# Patient Record
Sex: Female | Born: 1985 | Race: Black or African American | Hispanic: No | Marital: Single
Health system: Southern US, Community
[De-identification: ages and names within clinical notes are randomized; demographics above are authoritative.]

---
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
## 2004-05-02 ENCOUNTER — Ambulatory Visit (HOSPITAL_COMMUNITY): Admission: RE | Admit: 2004-05-02 | Discharge: 2004-05-02 | Payer: Self-pay | Admitting: *Deleted

## 2010-01-15 IMAGING — US US OB COMP LESS 14 WK
1 series · 14 of 18 positions shown · non-contrast
Comparison: none

CLINICAL DATA: Pregnant female with abdominal pain.  Gestational
age by last menstrual period is 8 weeks 3 days.

OBSTETRIC <14 WK US AND TRANSVAGINAL OB US
TECHNIQUE: Both transabdominal and transvaginal ultrasound
examinations were performed for complete evaluation of the
gestation as well as the maternal uterus, adnexal regions, and
pelvic cul-de-sac.

[Series 1: us ob comp less 14 wk · 0.28mm/px · 14 of 18 slices shown]
[im 1/18]
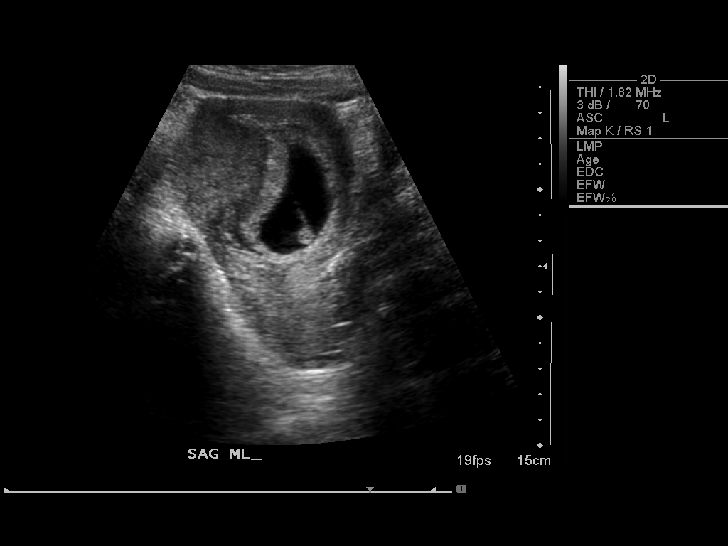
[im 2/18]
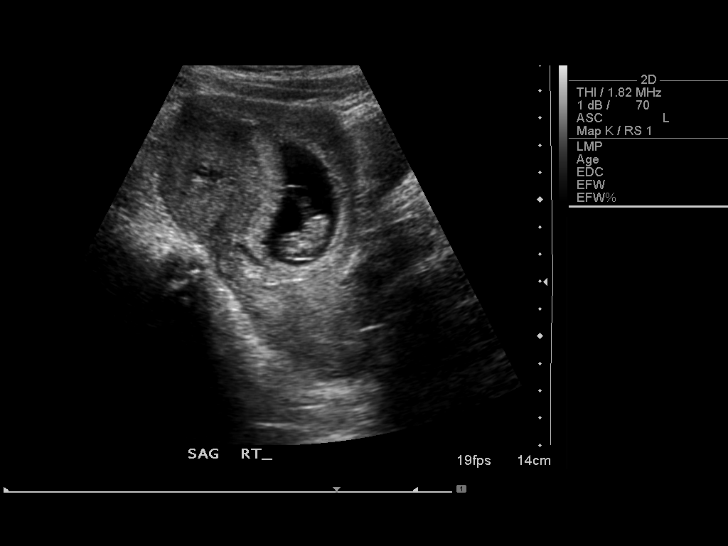
[im 4/18]
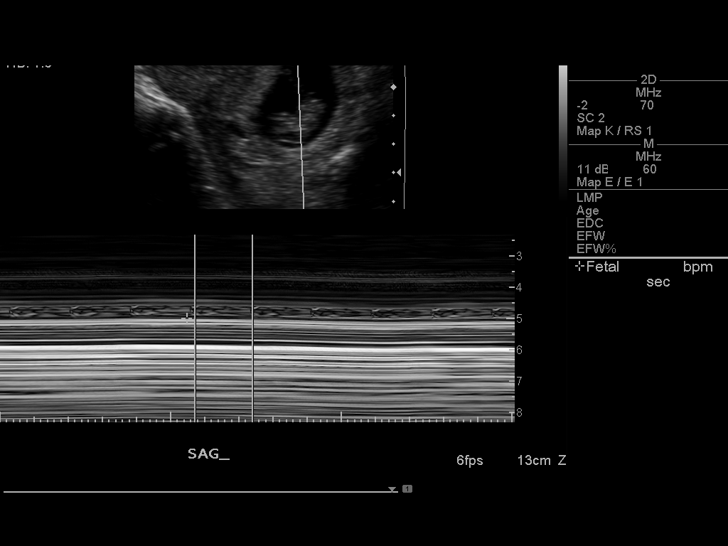
[im 5/18]
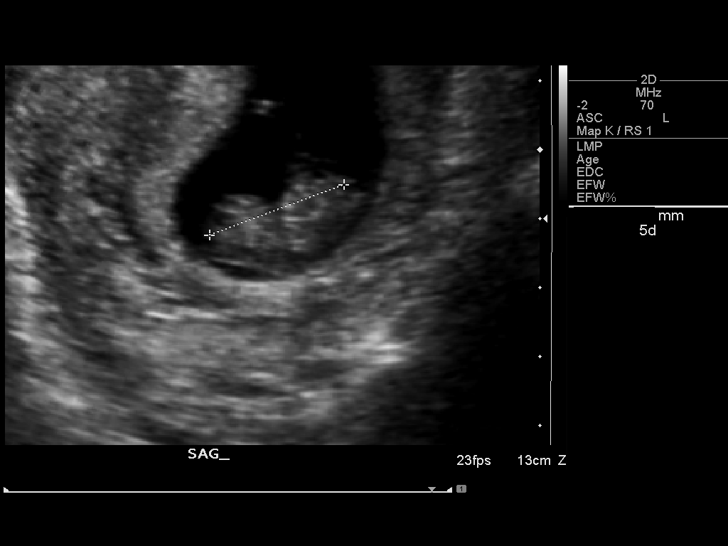
[im 6/18]
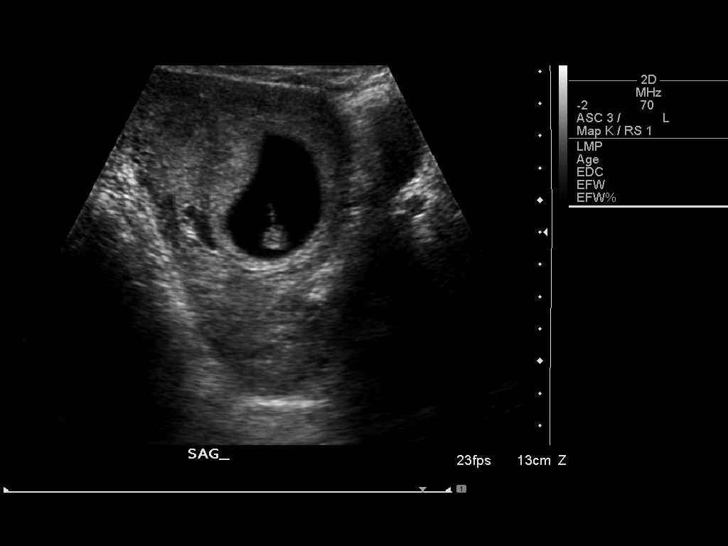
[im 8/18]
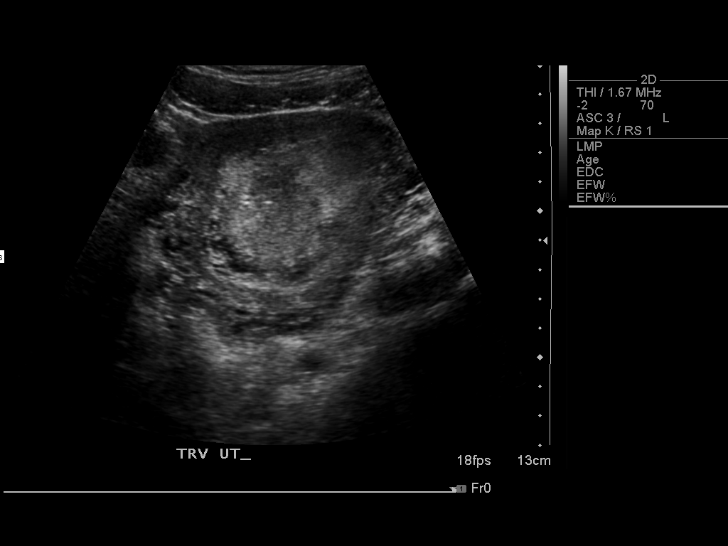
[im 9/18]
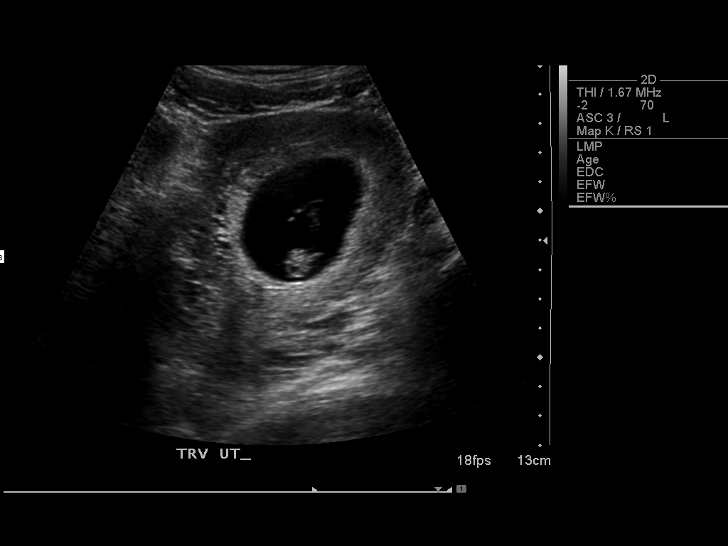
[im 10/18]
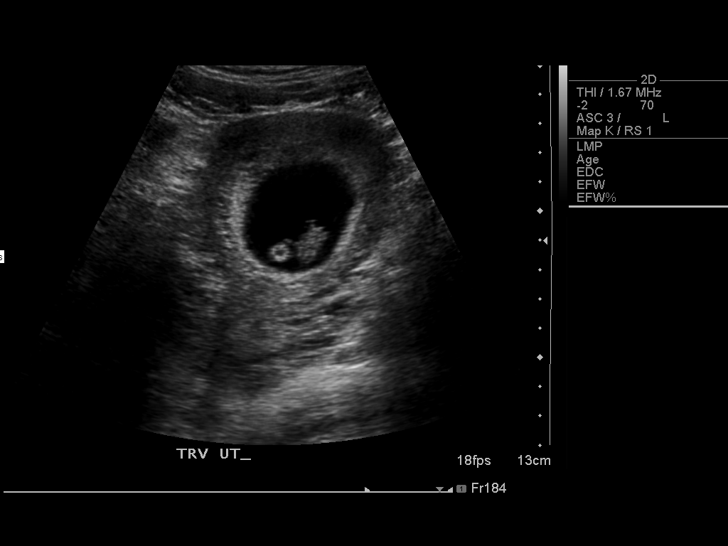
[im 11/18]
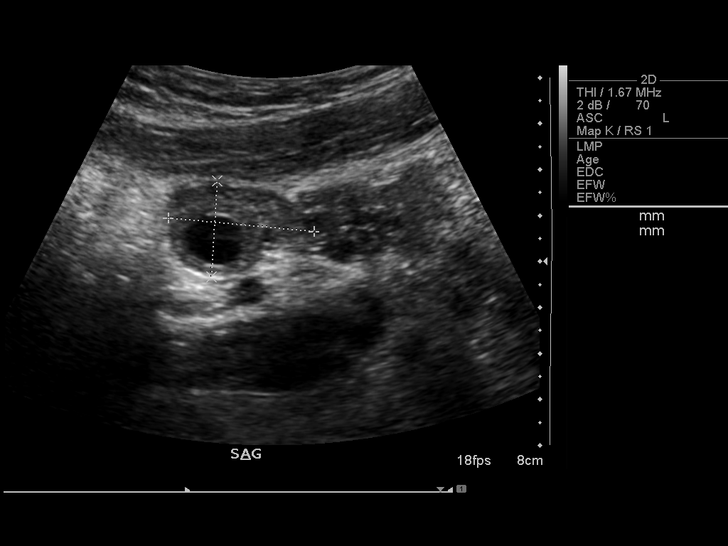
[im 13/18]
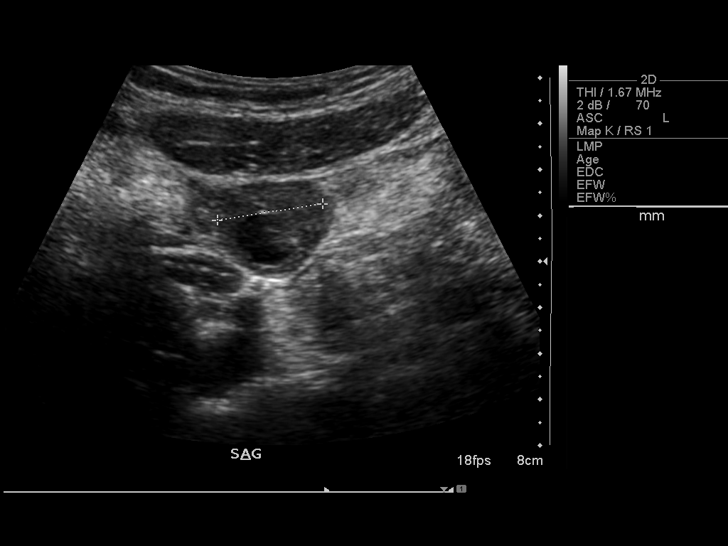
[im 14/18]
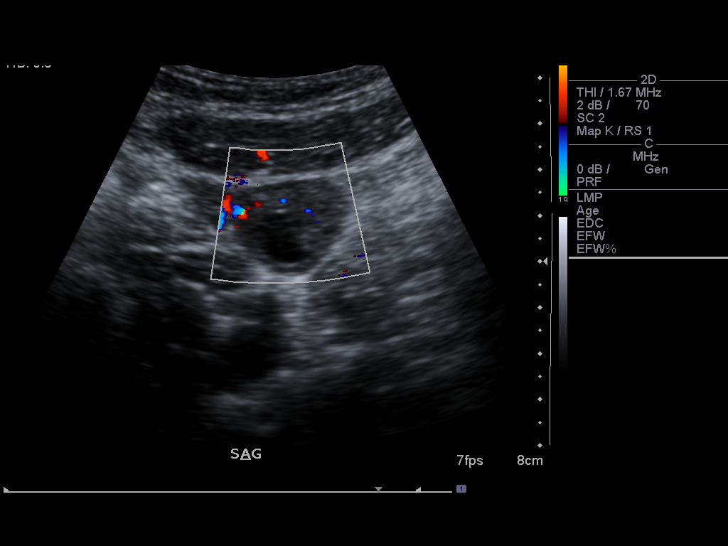
[im 15/18]
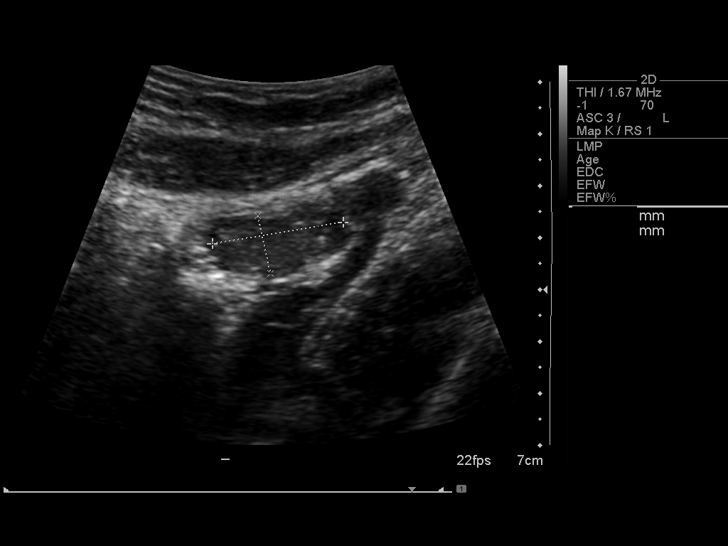
[im 17/18]
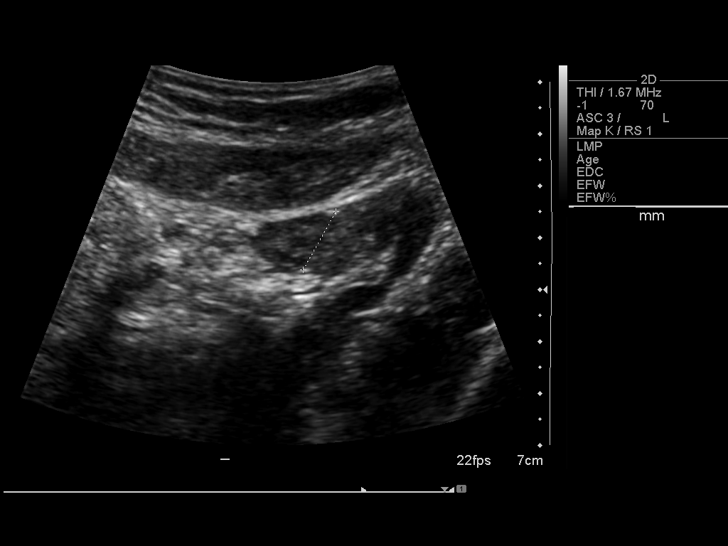
[im 18/18]
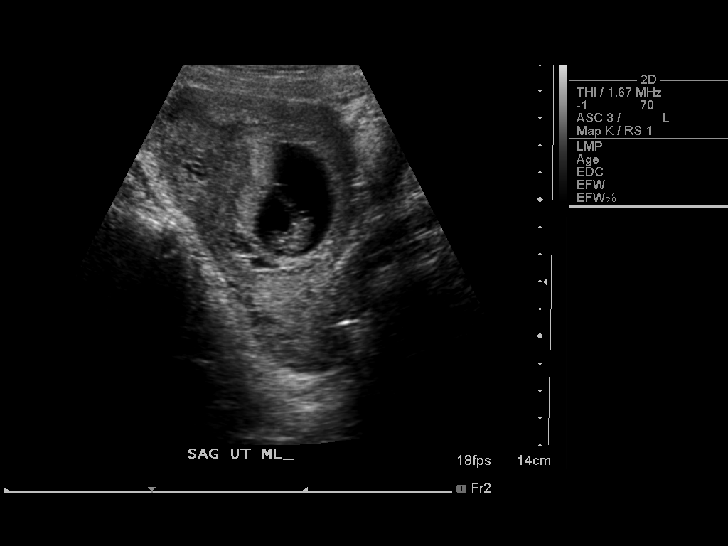

[14 of 18 positions shown; findings below may reference images not displayed]

A single living intrauterine pregnancy is identified with a heart
rate of 178 beats per minute.  Yolk sac is seen and appears normal.
Crown rump length is 20.8 mm for an estimated gestational age of 8
weeks 5 days.  Corpus luteum cyst is noted in the right ovary.
Left ovary appears normal.  Small subchorionic hemorrhage is seen.
No pelvic fluid.
IMPRESSION: Single living intrauterine pregnancy with estimated gestational age
of 8 weeks 5 days.  Small subchorionic hemorrhage is noted.

## 2019-05-02 ENCOUNTER — Encounter (HOSPITAL_COMMUNITY): Payer: Self-pay

## 2019-05-02 ENCOUNTER — Emergency Department (HOSPITAL_COMMUNITY)
Admission: EM | Admit: 2019-05-02 | Discharge: 2019-05-02 | Disposition: A | Discharge status: Home / Self Care | Payer: BC Managed Care – PPO | Attending: Emergency Medicine | Admitting: Emergency Medicine

## 2019-05-02 ENCOUNTER — Emergency Department (HOSPITAL_COMMUNITY): Payer: BC Managed Care – PPO

## 2019-05-02 ENCOUNTER — Other Ambulatory Visit: Payer: Self-pay

## 2019-05-02 DIAGNOSIS — K529 Noninfective gastroenteritis and colitis, unspecified: Secondary | ICD-10-CM | POA: Diagnosis not present

## 2019-05-02 DIAGNOSIS — R109 Unspecified abdominal pain: Secondary | ICD-10-CM | POA: Diagnosis present

## 2019-05-02 LAB — COMPREHENSIVE METABOLIC PANEL
ALT: 30 U/L (ref 0–44)
AST: 30 U/L (ref 15–41)
Albumin: 3.9 g/dL (ref 3.5–5.0)
Alkaline Phosphatase: 61 U/L (ref 38–126)
Anion gap: 10 (ref 5–15)
BUN: 9 mg/dL (ref 6–20)
CO2: 20 mmol/L — ABNORMAL LOW (ref 22–32)
Calcium: 8.8 mg/dL — ABNORMAL LOW (ref 8.9–10.3)
Chloride: 108 mmol/L (ref 98–111)
Creatinine, Ser: 0.82 mg/dL (ref 0.44–1.00)
GFR calc Af Amer: >60 mL/min (ref >60)
GFR calc non Af Amer: >60 mL/min (ref >60)
Glucose, Bld: 115 mg/dL — ABNORMAL HIGH (ref 70–99)
Potassium: 3.8 mmol/L (ref 3.5–5.1)
Sodium: 138 mmol/L (ref 135–145)
Total Bilirubin: 0.6 mg/dL (ref 0.3–1.2)
Total Protein: 7.8 g/dL (ref 6.5–8.1)

## 2019-05-02 LAB — CBC
HCT: 39.8 % (ref 36.0–46.0)
Hemoglobin: 11.8 g/dL — ABNORMAL LOW (ref 12.0–15.0)
MCH: 21.5 pg — ABNORMAL LOW (ref 26.0–34.0)
MCHC: 29.6 g/dL — ABNORMAL LOW (ref 30.0–36.0)
MCV: 72.4 fL — ABNORMAL LOW (ref 80.0–100.0)
Platelets: 281 10*3/uL (ref 150–400)
RBC: 5.5 MIL/uL — ABNORMAL HIGH (ref 3.87–5.11)
RDW: 23.9 % — ABNORMAL HIGH (ref 11.5–15.5)
WBC: 14.9 10*3/uL — ABNORMAL HIGH (ref 4.0–10.5)
nRBC: 0 % (ref 0.0–0.2)

## 2019-05-02 LAB — LIPASE, BLOOD: Lipase: 27 U/L (ref 11–51)

## 2019-05-02 MED ORDER — DICYCLOMINE HCL 20 MG PO TABS
20.0000 mg | ORAL_TABLET | Freq: Once | ORAL | Status: AC
  Administered 2019-05-02: 20 mg via ORAL
  Filled 2019-05-02: qty 1

## 2019-05-02 MED ORDER — SODIUM CHLORIDE 0.9 % IV BOLUS
1000.0000 mL | Freq: Once | INTRAVENOUS | Status: AC
  Administered 2019-05-02: 13:00:00 1000 mL via INTRAVENOUS

## 2019-05-02 MED ORDER — IOHEXOL 300 MG/ML  SOLN
100.0000 mL | Freq: Once | INTRAMUSCULAR | Status: AC | PRN
  Administered 2019-05-02: 13:00:00 100 mL via INTRAVENOUS

## 2019-05-02 MED ORDER — ONDANSETRON 4 MG PO TBDP: 4.0000 mg | ORAL_TABLET | Freq: Three times a day (TID) | ORAL | 0 refills | Status: AC | PRN

## 2019-05-02 MED ORDER — DICYCLOMINE HCL 20 MG PO TABS: 20.0000 mg | ORAL_TABLET | Freq: Two times a day (BID) | ORAL | 0 refills | Status: AC | PRN

## 2019-05-02 MED ORDER — SODIUM CHLORIDE 0.9% FLUSH: 3.0000 mL | Freq: Once | INTRAVENOUS | Status: DC

## 2019-05-02 MED ORDER — ONDANSETRON HCL 4 MG/2ML IJ SOLN
4.0000 mg | Freq: Once | INTRAMUSCULAR | Status: AC
  Administered 2019-05-02: 13:00:00 4 mg via INTRAVENOUS
  Filled 2019-05-02: qty 2

## 2019-05-02 NOTE — Discharge Instructions (Addendum)
It was my pleasure taking care of you today!   Bentyl as needed for abdominal cramping / pain.  Tylenol as needed for abdominal pain. Zofran as needed for nausea.   Call your primary care doctor on Monday to schedule a follow up appointment.   Return to ER for new or worsening symptoms, any additional concerns.

## 2019-05-02 NOTE — ED Notes (Addendum)
Tolerating fluids without nausea and/or vomiting

## 2019-05-02 NOTE — ED Provider Notes (Addendum)
[facility] EMERGENCY DEPARTMENT Provider Note   CSN: [id number] Arrival date & time: 05/02/19  1034     History   Chief Complaint Chief Complaint  Patient presents with   Abdominal Pain   Emesis    HPI Jodi Jackson is a 33 y.o. female.     The history is provided by the patient and medical records. No language interpreter was used.  Abdominal Pain Associated symptoms: diarrhea, nausea and vomiting   Associated symptoms: no constipation   Emesis Associated symptoms: abdominal pain and diarrhea    Jodi Jackson is a 33 y.o. female who presents to the Emergency Department complaining of generalized abdominal pain associated with nausea and multiple episodes of vomiting which began last night around 11 PM.  She endorses one episode of loose stool.  She states that she had the episode of diarrhea, then following that, passed a few very small clots of blood.  She has not had any further bleeding nor diarrhea.  Denies any history of similar.  She did have hysterectomy 3 weeks ago and felt as if everything was healing well and as to be expected.  No fever or chills.  No urinary symptoms or vaginal discharge.  No back pain, chest pain or shortness of breath.  No medications taken prior to arrival for symptoms.  History reviewed. No pertinent past medical history.  There are no active problems to display for this patient.   History reviewed. No pertinent surgical history.   OB History   No obstetric history on file.      Home Medications    Prior to Admission medications   Medication Sig Start Date End Date Taking? Authorizing Provider  dicyclomine (BENTYL) 20 MG tablet Take 1 tablet (20 mg total) by mouth 2 (two) times daily as needed (Abdominal cramping). 05/02/19   Tanda Morrissey, Ozella Almond, PA-C  ondansetron (ZOFRAN ODT) 4 MG disintegrating tablet Take 1 tablet (4 mg total) by mouth every 8 (eight) hours as needed for nausea or vomiting. 05/02/19   Cris Gibby,  Ozella Almond, PA-C    Family History No family history on file.  Social History Social History   Tobacco Use   Smoking status: Not on file  Substance Use Topics   Alcohol use: Not on file   Drug use: Not on file     Allergies   Patient has no known allergies.   Review of Systems Review of Systems  Gastrointestinal: Positive for abdominal pain, blood in stool, diarrhea, nausea and vomiting. Negative for abdominal distention, constipation and rectal pain.  All other systems reviewed and are negative.    Physical Exam Updated Vital Signs BP 108/81    Pulse 83    Temp 98.6 F (37 C) (Oral)    Resp 20    SpO2 100%   Physical Exam Vitals signs and nursing note reviewed.  Constitutional:      General: She is not in acute distress.    Appearance: She is well-developed.  HENT:     Head: Normocephalic and atraumatic.  Neck:     Musculoskeletal: Neck supple.  Cardiovascular:     Rate and Rhythm: Normal rate and regular rhythm.     Heart sounds: Normal heart sounds. No murmur.  Pulmonary:     Effort: Pulmonary effort is normal. No respiratory distress.     Breath sounds: Normal breath sounds.  Abdominal:     General: There is no distension.     Palpations: Abdomen is soft.  Comments: Generalized abdominal tenderness.  Genitourinary:    Comments: Multiple external hemorrhoids.  Skin:    General: Skin is warm and dry.  Neurological:     Mental Status: She is alert and oriented to person, place, and time.      ED Treatments / Results  Labs (all labs ordered are listed, but only abnormal results are displayed) Labs Reviewed  COMPREHENSIVE METABOLIC PANEL - Abnormal; Notable for the following components:      Result Value   CO2 20 (*)    Glucose, Bld 115 (*)    Calcium 8.8 (*)    All other components within normal limits  CBC - Abnormal; Notable for the following components:   WBC 14.9 (*)    RBC 5.50 (*)    Hemoglobin 11.8 (*)    MCV 72.4 (*)    MCH  21.5 (*)    MCHC 29.6 (*)    RDW 23.9 (*)    All other components within normal limits  LIPASE, BLOOD  URINALYSIS, ROUTINE W REFLEX MICROSCOPIC  POC OCCULT BLOOD, ED    EKG None  Radiology Ct Abdomen Pelvis W Contrast  Result Date: 05/02/2019 CLINICAL DATA:  Abdominal pain, nausea, vomiting, diarrhea. Three weeks status post hysterectomy. EXAM: CT ABDOMEN AND PELVIS WITH CONTRAST TECHNIQUE: Multidetector CT imaging of the abdomen and pelvis was performed using the standard protocol following bolus administration of intravenous contrast. CONTRAST:  OMNIPAQUE IOHEXOL 300 MG/ML  SOLN COMPARISON:  None. FINDINGS: Lower chest: No acute abnormality. Hepatobiliary: No focal liver abnormality is seen. No gallstones, gallbladder wall thickening, or biliary dilatation. Pancreas: Unremarkable. No pancreatic ductal dilatation or surrounding inflammatory changes. Spleen: Normal in size without focal abnormality. Adrenals/Urinary Tract: Adrenal glands appear normal. Kidneys are unremarkable without mass, stone or hydronephrosis. No ureteral or bladder calculi. Bladder is unremarkable, decompressed. Stomach/Bowel: No dilated large or small bowel loops. No definite evidence of a bowel wall inflammation. Questionable edematous thickening of the walls of the RIGHT colon. Stomach is unremarkable, partially decompressed. Vascular/Lymphatic: No significant vascular findings are present. No enlarged abdominal or pelvic lymph nodes. Reproductive: Status post hysterectomy. Expected small amount of fluid and fluid stranding within the pelvis. No circumscribed fluid collection or abscess like collection identified. Other: No free intraperitoneal air. Musculoskeletal: No acute or significant osseous findings. IMPRESSION: 1. Status post hysterectomy. Expected small amount of fluid and fluid stranding within the pelvis. No circumscribed fluid collection or abscess like collection. No free intraperitoneal air. 2.  Questionable edematous thickening of the walls of the RIGHT colon, raising the possibility of a mild colitis of infectious or inflammatory nature. No bowel obstruction. 3. Remainder of the abdomen and pelvis CT is unremarkable, as detailed above. No evidence of acute solid organ abnormality. No renal or ureteral calculi. Electronically Signed   By: Bary Richard M.D.   On: 05/02/2019 13:03    Procedures Procedures (including critical care time)  Medications Ordered in ED Medications  sodium chloride flush (NS) 0.9 % injection 3 mL (has no administration in time range)  sodium chloride 0.9 % bolus 1,000 mL (1,000 mLs Intravenous New Bag/Given 05/02/19 1237)  ondansetron (ZOFRAN) injection 4 mg (4 mg Intravenous Given 05/02/19 1238)  iohexol (OMNIPAQUE) 300 MG/ML solution 100 mL (100 mLs Intravenous Contrast Given 05/02/19 1243)  dicyclomine (BENTYL) tablet 20 mg (20 mg Oral Given 05/02/19 1345)     Initial Impression / Assessment and Plan / ED Course  I have reviewed the triage vital signs and the nursing notes.  Pertinent labs & imaging results that were available during my care of the patient were reviewed by me and considered in my medical decision making (see chart for details).       Jodi Jackson is a 33 y.o. female who presents to ED for generalized abdominal discomfort, nausea, vomiting and diarrhea which began last night.  Of note, patient is 3 weeks out s/p hysterectomy.  Labs reviewed and notable for leukocytosis of 14.9.  Hemoccult was performed, although not crossing over in computer system.  It was negative.  I confirmed this personally with the lab.  Given her leukocytosis and recent surgical procedure, will proceed to CT scan.  CT shows questionable edematous thickening of the walls of the right colon c/w mild infectious versus inflammatory colitis.  No complications from recent procedure.  Patient reevaluated and feels improved after symptomatic dictations in the emergency  department.  Discussed home care instructions, follow-up care and return precautions.  All questions answered.   Final Clinical Impressions(s) / ED Diagnoses   Final diagnoses:  Colitis    ED Discharge Orders         Ordered    ondansetron (ZOFRAN ODT) 4 MG disintegrating tablet  Every 8 hours PRN     05/02/19 1405    dicyclomine (BENTYL) 20 MG tablet  2 times daily PRN     05/02/19 1405           Romilda Proby, Chase PicketJaime Pilcher, PA-C 05/02/19 1414    Alvira MondaySchlossman, Erin, MD 05/03/19 2109

## 2019-05-02 NOTE — ED Notes (Signed)
Patient Alert and oriented to baseline. Stable and ambulatory to baseline. Patient verbalized understanding of the discharge instructions.  Patient belongings were taken by the patient.   

## 2019-05-02 NOTE — ED Triage Notes (Signed)
Patient complains of vomiting and diarrhea since eating at [employer] a last night. Reports abd. Cramping with same

## 2019-05-04 LAB — POC OCCULT BLOOD, ED: Fecal Occult Bld: NEGATIVE
